# Patient Record
Sex: Female | Born: 1945 | Race: Black or African American | Hispanic: No | Smoking: Former smoker
Health system: Southern US, Community
[De-identification: ages and names within clinical notes are randomized; demographics above are authoritative.]

## PROBLEM LIST (undated history)

## (undated) DIAGNOSIS — I10 Essential (primary) hypertension: Secondary | ICD-10-CM

## (undated) HISTORY — PX: BACK SURGERY: SHX140

---

## 2005-10-12 ENCOUNTER — Ambulatory Visit: Payer: Self-pay | Admitting: Internal Medicine

## 2007-02-20 ENCOUNTER — Ambulatory Visit: Payer: Self-pay | Admitting: Internal Medicine

## 2007-02-26 ENCOUNTER — Ambulatory Visit: Payer: Self-pay | Admitting: Gastroenterology

## 2008-02-22 ENCOUNTER — Ambulatory Visit: Payer: Self-pay | Admitting: Internal Medicine

## 2009-03-10 ENCOUNTER — Ambulatory Visit: Payer: Self-pay | Admitting: Internal Medicine

## 2010-03-16 ENCOUNTER — Ambulatory Visit: Payer: Self-pay | Admitting: Internal Medicine

## 2010-12-01 ENCOUNTER — Ambulatory Visit: Payer: Self-pay | Admitting: Gastroenterology

## 2011-04-12 ENCOUNTER — Ambulatory Visit: Payer: Self-pay | Admitting: Internal Medicine

## 2012-04-12 ENCOUNTER — Ambulatory Visit: Payer: Self-pay | Admitting: Internal Medicine

## 2013-04-15 ENCOUNTER — Ambulatory Visit: Payer: Self-pay | Admitting: Internal Medicine

## 2013-07-28 ENCOUNTER — Emergency Department: Payer: Self-pay | Admitting: Emergency Medicine

## 2014-02-28 DIAGNOSIS — Z0000 Encounter for general adult medical examination without abnormal findings: Secondary | ICD-10-CM | POA: Diagnosis not present

## 2014-03-21 DIAGNOSIS — H269 Unspecified cataract: Secondary | ICD-10-CM | POA: Diagnosis not present

## 2014-04-17 ENCOUNTER — Ambulatory Visit: Payer: Self-pay | Admitting: Internal Medicine

## 2014-04-17 DIAGNOSIS — Z1231 Encounter for screening mammogram for malignant neoplasm of breast: Secondary | ICD-10-CM | POA: Diagnosis not present

## 2014-11-06 DIAGNOSIS — M722 Plantar fascial fibromatosis: Secondary | ICD-10-CM | POA: Diagnosis not present

## 2015-02-23 DIAGNOSIS — Z0000 Encounter for general adult medical examination without abnormal findings: Secondary | ICD-10-CM | POA: Diagnosis not present

## 2015-03-02 ENCOUNTER — Other Ambulatory Visit: Payer: Self-pay | Admitting: Internal Medicine

## 2015-03-02 DIAGNOSIS — Z0000 Encounter for general adult medical examination without abnormal findings: Secondary | ICD-10-CM | POA: Diagnosis not present

## 2015-03-02 DIAGNOSIS — Z6841 Body Mass Index (BMI) 40.0 and over, adult: Secondary | ICD-10-CM | POA: Diagnosis not present

## 2015-03-02 DIAGNOSIS — Z1231 Encounter for screening mammogram for malignant neoplasm of breast: Secondary | ICD-10-CM

## 2015-03-24 DIAGNOSIS — H2513 Age-related nuclear cataract, bilateral: Secondary | ICD-10-CM | POA: Diagnosis not present

## 2015-04-20 ENCOUNTER — Other Ambulatory Visit: Payer: Self-pay | Admitting: Internal Medicine

## 2015-04-20 ENCOUNTER — Ambulatory Visit
Admission: RE | Admit: 2015-04-20 | Discharge: 2015-04-20 | Disposition: A | Discharge status: Home / Self Care | Payer: Commercial Managed Care - HMO | Source: Ambulatory Visit | Attending: Internal Medicine | Admitting: Internal Medicine

## 2015-04-20 DIAGNOSIS — Z1231 Encounter for screening mammogram for malignant neoplasm of breast: Secondary | ICD-10-CM

## 2016-02-24 DIAGNOSIS — Z0000 Encounter for general adult medical examination without abnormal findings: Secondary | ICD-10-CM | POA: Diagnosis not present

## 2016-03-02 DIAGNOSIS — Z0000 Encounter for general adult medical examination without abnormal findings: Secondary | ICD-10-CM | POA: Diagnosis not present

## 2016-03-02 DIAGNOSIS — Z6841 Body Mass Index (BMI) 40.0 and over, adult: Secondary | ICD-10-CM | POA: Diagnosis not present

## 2016-03-08 ENCOUNTER — Other Ambulatory Visit: Payer: Self-pay | Admitting: Internal Medicine

## 2016-03-08 DIAGNOSIS — Z1231 Encounter for screening mammogram for malignant neoplasm of breast: Secondary | ICD-10-CM

## 2016-04-21 ENCOUNTER — Ambulatory Visit
Admission: RE | Admit: 2016-04-21 | Discharge: 2016-04-21 | Disposition: A | Discharge status: Home / Self Care | Payer: Commercial Managed Care - HMO | Source: Ambulatory Visit | Attending: Internal Medicine | Admitting: Internal Medicine

## 2016-04-21 DIAGNOSIS — Z1231 Encounter for screening mammogram for malignant neoplasm of breast: Secondary | ICD-10-CM | POA: Diagnosis not present

## 2016-05-25 DIAGNOSIS — H2513 Age-related nuclear cataract, bilateral: Secondary | ICD-10-CM | POA: Diagnosis not present

## 2017-02-24 DIAGNOSIS — Z0000 Encounter for general adult medical examination without abnormal findings: Secondary | ICD-10-CM | POA: Diagnosis not present

## 2017-02-24 DIAGNOSIS — I10 Essential (primary) hypertension: Secondary | ICD-10-CM | POA: Diagnosis not present

## 2017-03-03 DIAGNOSIS — Z6841 Body Mass Index (BMI) 40.0 and over, adult: Secondary | ICD-10-CM | POA: Diagnosis not present

## 2017-03-03 DIAGNOSIS — Z0000 Encounter for general adult medical examination without abnormal findings: Secondary | ICD-10-CM | POA: Diagnosis not present

## 2017-03-03 DIAGNOSIS — Z79899 Other long term (current) drug therapy: Secondary | ICD-10-CM | POA: Diagnosis not present

## 2017-03-27 ENCOUNTER — Other Ambulatory Visit: Payer: Self-pay | Admitting: Internal Medicine

## 2017-03-27 DIAGNOSIS — Z1231 Encounter for screening mammogram for malignant neoplasm of breast: Secondary | ICD-10-CM

## 2017-04-24 ENCOUNTER — Ambulatory Visit
Admission: RE | Admit: 2017-04-24 | Discharge: 2017-04-24 | Disposition: A | Discharge status: Home / Self Care | Payer: Medicare HMO | Source: Ambulatory Visit | Attending: Internal Medicine | Admitting: Internal Medicine

## 2017-04-24 DIAGNOSIS — Z1231 Encounter for screening mammogram for malignant neoplasm of breast: Secondary | ICD-10-CM | POA: Insufficient documentation

## 2017-05-29 DIAGNOSIS — H2513 Age-related nuclear cataract, bilateral: Secondary | ICD-10-CM | POA: Diagnosis not present

## 2017-06-05 DIAGNOSIS — Z0100 Encounter for examination of eyes and vision without abnormal findings: Secondary | ICD-10-CM | POA: Diagnosis not present

## 2018-02-28 DIAGNOSIS — Z79899 Other long term (current) drug therapy: Secondary | ICD-10-CM | POA: Diagnosis not present

## 2018-03-07 DIAGNOSIS — E538 Deficiency of other specified B group vitamins: Secondary | ICD-10-CM | POA: Diagnosis not present

## 2018-03-07 DIAGNOSIS — Z0000 Encounter for general adult medical examination without abnormal findings: Secondary | ICD-10-CM | POA: Diagnosis not present

## 2018-03-07 DIAGNOSIS — Z79899 Other long term (current) drug therapy: Secondary | ICD-10-CM | POA: Diagnosis not present

## 2018-03-07 DIAGNOSIS — Z6841 Body Mass Index (BMI) 40.0 and over, adult: Secondary | ICD-10-CM | POA: Diagnosis not present

## 2018-03-27 ENCOUNTER — Other Ambulatory Visit: Payer: Self-pay | Admitting: Internal Medicine

## 2018-03-27 DIAGNOSIS — Z1231 Encounter for screening mammogram for malignant neoplasm of breast: Secondary | ICD-10-CM

## 2018-08-07 ENCOUNTER — Other Ambulatory Visit: Payer: Self-pay

## 2018-08-07 ENCOUNTER — Ambulatory Visit
Admission: RE | Admit: 2018-08-07 | Discharge: 2018-08-07 | Disposition: A | Discharge status: Home / Self Care | Payer: Medicare HMO | Source: Ambulatory Visit | Attending: Internal Medicine | Admitting: Internal Medicine

## 2018-08-07 DIAGNOSIS — Z1231 Encounter for screening mammogram for malignant neoplasm of breast: Secondary | ICD-10-CM | POA: Diagnosis not present

## 2019-01-15 DIAGNOSIS — J0100 Acute maxillary sinusitis, unspecified: Secondary | ICD-10-CM | POA: Diagnosis not present

## 2019-01-15 DIAGNOSIS — Z87891 Personal history of nicotine dependence: Secondary | ICD-10-CM | POA: Diagnosis not present

## 2019-03-04 DIAGNOSIS — Z79899 Other long term (current) drug therapy: Secondary | ICD-10-CM | POA: Diagnosis not present

## 2019-03-04 DIAGNOSIS — E538 Deficiency of other specified B group vitamins: Secondary | ICD-10-CM | POA: Diagnosis not present

## 2019-03-11 DIAGNOSIS — Z0000 Encounter for general adult medical examination without abnormal findings: Secondary | ICD-10-CM | POA: Diagnosis not present

## 2019-03-11 DIAGNOSIS — R0609 Other forms of dyspnea: Secondary | ICD-10-CM | POA: Diagnosis not present

## 2019-03-11 DIAGNOSIS — Z6841 Body Mass Index (BMI) 40.0 and over, adult: Secondary | ICD-10-CM | POA: Diagnosis not present

## 2019-03-11 DIAGNOSIS — R0600 Dyspnea, unspecified: Secondary | ICD-10-CM | POA: Diagnosis not present

## 2019-03-11 DIAGNOSIS — I129 Hypertensive chronic kidney disease with stage 1 through stage 4 chronic kidney disease, or unspecified chronic kidney disease: Secondary | ICD-10-CM | POA: Diagnosis not present

## 2019-03-11 DIAGNOSIS — Z79899 Other long term (current) drug therapy: Secondary | ICD-10-CM | POA: Diagnosis not present

## 2019-03-11 DIAGNOSIS — N183 Chronic kidney disease, stage 3 unspecified: Secondary | ICD-10-CM | POA: Diagnosis not present

## 2019-07-03 ENCOUNTER — Other Ambulatory Visit: Payer: Self-pay | Admitting: Internal Medicine

## 2019-07-03 DIAGNOSIS — Z1231 Encounter for screening mammogram for malignant neoplasm of breast: Secondary | ICD-10-CM

## 2019-08-08 ENCOUNTER — Ambulatory Visit
Admission: RE | Admit: 2019-08-08 | Discharge: 2019-08-08 | Disposition: A | Discharge status: Home / Self Care | Payer: Medicare HMO | Source: Ambulatory Visit | Attending: Internal Medicine | Admitting: Internal Medicine

## 2019-08-08 DIAGNOSIS — Z1231 Encounter for screening mammogram for malignant neoplasm of breast: Secondary | ICD-10-CM | POA: Diagnosis not present

## 2019-09-05 DIAGNOSIS — H2513 Age-related nuclear cataract, bilateral: Secondary | ICD-10-CM | POA: Diagnosis not present

## 2019-09-09 DIAGNOSIS — Z79899 Other long term (current) drug therapy: Secondary | ICD-10-CM | POA: Diagnosis not present

## 2020-03-04 DIAGNOSIS — Z79899 Other long term (current) drug therapy: Secondary | ICD-10-CM | POA: Diagnosis not present

## 2020-03-11 DIAGNOSIS — N183 Chronic kidney disease, stage 3 unspecified: Secondary | ICD-10-CM | POA: Diagnosis not present

## 2020-03-11 DIAGNOSIS — Z79899 Other long term (current) drug therapy: Secondary | ICD-10-CM | POA: Diagnosis not present

## 2020-03-11 DIAGNOSIS — M7582 Other shoulder lesions, left shoulder: Secondary | ICD-10-CM | POA: Diagnosis not present

## 2020-03-11 DIAGNOSIS — Z0000 Encounter for general adult medical examination without abnormal findings: Secondary | ICD-10-CM | POA: Diagnosis not present

## 2020-03-11 DIAGNOSIS — Z6841 Body Mass Index (BMI) 40.0 and over, adult: Secondary | ICD-10-CM | POA: Diagnosis not present

## 2020-05-13 DIAGNOSIS — N1831 Chronic kidney disease, stage 3a: Secondary | ICD-10-CM | POA: Diagnosis not present

## 2020-07-01 ENCOUNTER — Other Ambulatory Visit: Payer: Self-pay | Admitting: Internal Medicine

## 2020-07-01 DIAGNOSIS — Z1231 Encounter for screening mammogram for malignant neoplasm of breast: Secondary | ICD-10-CM

## 2020-07-10 ENCOUNTER — Other Ambulatory Visit: Payer: Self-pay | Admitting: Orthopedic Surgery

## 2020-07-10 DIAGNOSIS — M19019 Primary osteoarthritis, unspecified shoulder: Secondary | ICD-10-CM | POA: Diagnosis not present

## 2020-07-10 DIAGNOSIS — M75102 Unspecified rotator cuff tear or rupture of left shoulder, not specified as traumatic: Secondary | ICD-10-CM | POA: Diagnosis not present

## 2020-07-10 DIAGNOSIS — M19012 Primary osteoarthritis, left shoulder: Secondary | ICD-10-CM

## 2020-07-10 DIAGNOSIS — M25512 Pain in left shoulder: Secondary | ICD-10-CM | POA: Diagnosis not present

## 2020-07-10 DIAGNOSIS — R29898 Other symptoms and signs involving the musculoskeletal system: Secondary | ICD-10-CM

## 2020-07-10 DIAGNOSIS — N1831 Chronic kidney disease, stage 3a: Secondary | ICD-10-CM | POA: Diagnosis not present

## 2020-07-23 ENCOUNTER — Other Ambulatory Visit: Payer: Self-pay

## 2020-07-23 ENCOUNTER — Ambulatory Visit
Admission: RE | Admit: 2020-07-23 | Discharge: 2020-07-23 | Disposition: A | Discharge status: Home / Self Care | Payer: Medicare HMO | Source: Ambulatory Visit | Attending: Orthopedic Surgery | Admitting: Orthopedic Surgery

## 2020-07-23 DIAGNOSIS — R29898 Other symptoms and signs involving the musculoskeletal system: Secondary | ICD-10-CM | POA: Diagnosis not present

## 2020-07-23 DIAGNOSIS — M19012 Primary osteoarthritis, left shoulder: Secondary | ICD-10-CM

## 2020-07-23 DIAGNOSIS — R600 Localized edema: Secondary | ICD-10-CM | POA: Diagnosis not present

## 2020-07-23 DIAGNOSIS — M25412 Effusion, left shoulder: Secondary | ICD-10-CM | POA: Diagnosis not present

## 2020-07-23 DIAGNOSIS — M7552 Bursitis of left shoulder: Secondary | ICD-10-CM | POA: Diagnosis not present

## 2020-07-31 DIAGNOSIS — M7502 Adhesive capsulitis of left shoulder: Secondary | ICD-10-CM | POA: Diagnosis not present

## 2020-08-10 ENCOUNTER — Ambulatory Visit
Admission: RE | Admit: 2020-08-10 | Discharge: 2020-08-10 | Disposition: A | Discharge status: Home / Self Care | Payer: Medicare HMO | Source: Ambulatory Visit | Attending: Internal Medicine | Admitting: Internal Medicine

## 2020-08-10 ENCOUNTER — Other Ambulatory Visit: Payer: Self-pay

## 2020-08-10 DIAGNOSIS — Z1231 Encounter for screening mammogram for malignant neoplasm of breast: Secondary | ICD-10-CM | POA: Diagnosis not present

## 2020-08-17 ENCOUNTER — Other Ambulatory Visit: Payer: Self-pay | Admitting: Surgery

## 2020-08-17 DIAGNOSIS — M7582 Other shoulder lesions, left shoulder: Secondary | ICD-10-CM | POA: Diagnosis not present

## 2020-08-20 ENCOUNTER — Other Ambulatory Visit: Payer: Self-pay

## 2020-08-20 ENCOUNTER — Encounter
Admission: RE | Admit: 2020-08-20 | Discharge: 2020-08-20 | Disposition: A | Discharge status: Home / Self Care | Payer: Medicare HMO | Source: Ambulatory Visit | Attending: Surgery | Admitting: Surgery

## 2020-08-20 HISTORY — DX: Essential (primary) hypertension: I10

## 2020-08-20 NOTE — Patient Instructions (Addendum)
Your procedure is scheduled on: 08/26/20 Report to DAY SURGERY DEPARTMENT LOCATED ON 2ND FLOOR MEDICAL MALL ENTRANCE. To find out your arrival time please call [phone] between 1PM - 3PM on 08/25/20 .  Remember: Instructions that are not followed completely may result in serious medical risk, up to and including death, or upon the discretion of your surgeon and anesthesiologist your surgery may need to be rescheduled.     _X__ 1. Do not eat food after midnight the night before your procedure.                 No gum chewing or hard candies. You may drink clear liquids up to 2 hours                 before you are scheduled to arrive for your surgery-                  Clear Liquids include:  water, apple juice without pulp, clear carbohydrate                 drink such as Clearfast or Gatorade, Black Coffee or Tea (Do not add                 anything to coffee or tea). Diabetics water only  Drink the Ensure "Clear" Pre Surgery drink 2 hours before arriving.  __X__2.  On the morning of surgery brush your teeth with toothpaste and water, you                 may rinse your mouth with mouthwash if you wish.  Do not swallow any              toothpaste of mouthwash.     _X__ 3.  No Alcohol for 24 hours before or after surgery.   _X__ 4.  Do Not Smoke or use e-cigarettes For 24 Hours Prior to Your Surgery.                 Do not use any chewable tobacco products for at least 6 hours prior to                 surgery.  ____  5.  Bring all medications with you on the day of surgery if instructed.   __X__  6.  Notify your doctor if there is any change in your medical condition      (cold, fever, infections).     Do not wear jewelry, make-up, hairpins, clips or nail polish. Do not wear lotions, powders, or perfumes. No Deodorant Do not shave 48 hours prior to surgery. Men may shave face and neck. Do not bring valuables to the hospital.    [facility] is not responsible for any belongings or  valuables.  Contacts, dentures/partials or body piercings may not be worn into surgery. Bring a case for your contacts, glasses or hearing aids, a denture cup will be supplied. Leave your suitcase in the car. After surgery it may be brought to your room. For patients admitted to the hospital, discharge time is determined by your treatment team.   Patients discharged the day of surgery will not be allowed to drive home.   Please read over the following fact sheets that you were given:   MRSA Information, Ensure drink, Incentive spirometer, CHG soap  __X__ Take these medicines the morning of surgery with A SIP OF WATER:    1. none  2.   3.  4.  5.  6.  ____ Fleet Enema (as directed)   __X__ Use CHG Soap/SAGE wipes as directed  ____ Use inhalers on the day of surgery  ____ Stop metformin/Janumet/Farxiga 2 days prior to surgery    ____ Take 1/2 of usual insulin dose the night before surgery. No insulin the morning          of surgery.   ____ Stop Blood Thinners Coumadin/Plavix/Xarelto/Pleta/Pradaxa/Eliquis/Effient/Aspirin  on   Or contact your Surgeon, Cardiologist or Medical Doctor regarding  ability to stop your blood thinners  __X__ Stop Anti-inflammatories 7 days before surgery such as Advil, Ibuprofen, Motrin,  BC or Goodies Powder, Naprosyn, Naproxen, Aleve, Aspirin    __X__ Stop all herbal supplements, fish oil or vitamin E until after surgery.    ____ Bring C-Pap to the hospital.    Stop the naproxen sodium (ALEVE) starting today 08/20/20 per telephone instructions. This may be resumed after your surgery.

## 2020-08-24 ENCOUNTER — Other Ambulatory Visit: Payer: Self-pay

## 2020-08-24 ENCOUNTER — Other Ambulatory Visit
Admission: RE | Admit: 2020-08-24 | Discharge: 2020-08-24 | Disposition: A | Discharge status: Home / Self Care | Payer: Medicare HMO | Source: Ambulatory Visit | Attending: Surgery | Admitting: Surgery

## 2020-08-24 DIAGNOSIS — Z01818 Encounter for other preprocedural examination: Secondary | ICD-10-CM | POA: Diagnosis not present

## 2020-08-24 LAB — BASIC METABOLIC PANEL
Anion gap: 9 (ref 5–15)
BUN: 22 mg/dL (ref 8–23)
CO2: 29 mmol/L (ref 22–32)
Calcium: 9.4 mg/dL (ref 8.9–10.3)
Chloride: 102 mmol/L (ref 98–111)
Creatinine, Ser: 1.35 mg/dL — ABNORMAL HIGH (ref 0.44–1.00)
GFR, Estimated: 41 mL/min — ABNORMAL LOW (ref >60)
Glucose, Bld: 92 mg/dL (ref 70–99)
Potassium: 3.4 mmol/L — ABNORMAL LOW (ref 3.5–5.1)
Sodium: 140 mmol/L (ref 135–145)

## 2020-08-24 LAB — CBC
HCT: 36.3 % (ref 36.0–46.0)
Hemoglobin: 11.6 g/dL — ABNORMAL LOW (ref 12.0–15.0)
MCH: 26.8 pg (ref 26.0–34.0)
MCHC: 32 g/dL (ref 30.0–36.0)
MCV: 83.8 fL (ref 80.0–100.0)
Platelets: 240 10*3/uL (ref 150–400)
RBC: 4.33 MIL/uL (ref 3.87–5.11)
RDW: 13.4 % (ref 11.5–15.5)
WBC: 5.3 10*3/uL (ref 4.0–10.5)
nRBC: 0 % (ref 0.0–0.2)

## 2020-08-26 ENCOUNTER — Encounter: Payer: Self-pay | Admitting: Surgery

## 2020-08-26 ENCOUNTER — Ambulatory Visit: Payer: Medicare HMO

## 2020-08-26 ENCOUNTER — Encounter
Admission: RE | Disposition: A | Discharge status: Home / Self Care | Payer: Self-pay | Source: Home / Self Care | Attending: Surgery

## 2020-08-26 ENCOUNTER — Ambulatory Visit: Payer: Medicare HMO | Admitting: Anesthesiology

## 2020-08-26 ENCOUNTER — Ambulatory Visit
Admission: RE | Admit: 2020-08-26 | Discharge: 2020-08-26 | Disposition: A | Discharge status: Home / Self Care | Payer: Medicare HMO | Attending: Surgery | Admitting: Surgery

## 2020-08-26 ENCOUNTER — Other Ambulatory Visit: Payer: Self-pay

## 2020-08-26 DIAGNOSIS — M24112 Other articular cartilage disorders, left shoulder: Secondary | ICD-10-CM | POA: Diagnosis not present

## 2020-08-26 DIAGNOSIS — M19012 Primary osteoarthritis, left shoulder: Secondary | ICD-10-CM | POA: Insufficient documentation

## 2020-08-26 DIAGNOSIS — M25512 Pain in left shoulder: Secondary | ICD-10-CM | POA: Diagnosis not present

## 2020-08-26 DIAGNOSIS — M659 Synovitis and tenosynovitis, unspecified: Secondary | ICD-10-CM | POA: Insufficient documentation

## 2020-08-26 DIAGNOSIS — M75112 Incomplete rotator cuff tear or rupture of left shoulder, not specified as traumatic: Secondary | ICD-10-CM | POA: Diagnosis not present

## 2020-08-26 DIAGNOSIS — Z888 Allergy status to other drugs, medicaments and biological substances status: Secondary | ICD-10-CM | POA: Diagnosis not present

## 2020-08-26 DIAGNOSIS — M7522 Bicipital tendinitis, left shoulder: Secondary | ICD-10-CM | POA: Diagnosis not present

## 2020-08-26 DIAGNOSIS — Z87891 Personal history of nicotine dependence: Secondary | ICD-10-CM | POA: Diagnosis not present

## 2020-08-26 DIAGNOSIS — M94212 Chondromalacia, left shoulder: Secondary | ICD-10-CM | POA: Insufficient documentation

## 2020-08-26 DIAGNOSIS — M25812 Other specified joint disorders, left shoulder: Secondary | ICD-10-CM | POA: Diagnosis not present

## 2020-08-26 DIAGNOSIS — Z419 Encounter for procedure for purposes other than remedying health state, unspecified: Secondary | ICD-10-CM

## 2020-08-26 DIAGNOSIS — G8918 Other acute postprocedural pain: Secondary | ICD-10-CM | POA: Diagnosis not present

## 2020-08-26 DIAGNOSIS — M7592 Shoulder lesion, unspecified, left shoulder: Secondary | ICD-10-CM | POA: Diagnosis not present

## 2020-08-26 DIAGNOSIS — Z79899 Other long term (current) drug therapy: Secondary | ICD-10-CM | POA: Insufficient documentation

## 2020-08-26 DIAGNOSIS — M7582 Other shoulder lesions, left shoulder: Secondary | ICD-10-CM | POA: Diagnosis not present

## 2020-08-26 HISTORY — PX: SHOULDER ARTHROSCOPY WITH SUBACROMIAL DECOMPRESSION, ROTATOR CUFF REPAIR AND BICEP TENDON REPAIR: SHX5687

## 2020-08-26 SURGERY — SHOULDER ARTHROSCOPY WITH SUBACROMIAL DECOMPRESSION, ROTATOR CUFF REPAIR AND BICEP TENDON REPAIR
Anesthesia: General | Site: Shoulder | Laterality: Left

## 2020-08-26 MED ORDER — FAMOTIDINE 20 MG PO TABS
ORAL_TABLET | ORAL | Status: AC
  Administered 2020-08-26: 20 mg via ORAL
  Filled 2020-08-26: qty 1

## 2020-08-26 MED ORDER — BUPIVACAINE-EPINEPHRINE (PF) 0.5% -1:200000 IJ SOLN
INTRAMUSCULAR | Status: AC
  Filled 2020-08-26: qty 30

## 2020-08-26 MED ORDER — ONDANSETRON HCL 4 MG PO TABS: 4.0000 mg | ORAL_TABLET | Freq: Four times a day (QID) | ORAL | Status: DC | PRN

## 2020-08-26 MED ORDER — ORAL CARE MOUTH RINSE: 15.0000 mL | Freq: Once | OROMUCOSAL | Status: AC

## 2020-08-26 MED ORDER — CEFAZOLIN SODIUM-DEXTROSE 2-4 GM/100ML-% IV SOLN
2.0000 g | INTRAVENOUS | Status: AC
  Administered 2020-08-26: 2 g via INTRAVENOUS

## 2020-08-26 MED ORDER — METOCLOPRAMIDE HCL 5 MG/ML IJ SOLN: 5.0000 mg | Freq: Three times a day (TID) | INTRAMUSCULAR | Status: DC | PRN

## 2020-08-26 MED ORDER — EPHEDRINE SULFATE 50 MG/ML IJ SOLN
INTRAMUSCULAR | Status: DC | PRN
Start: 2020-08-26 — End: 2020-08-26
  Administered 2020-08-26: 5 mg via INTRAVENOUS

## 2020-08-26 MED ORDER — LACTATED RINGERS IV SOLN: INTRAVENOUS | Status: DC

## 2020-08-26 MED ORDER — LIDOCAINE HCL (PF) 1 % IJ SOLN
INTRAMUSCULAR | Status: DC | PRN
  Administered 2020-08-26: 3 mL via SUBCUTANEOUS

## 2020-08-26 MED ORDER — PHENYLEPHRINE HCL (PRESSORS) 10 MG/ML IV SOLN
INTRAVENOUS | Status: DC | PRN
  Administered 2020-08-26: 150 ug via INTRAVENOUS
  Administered 2020-08-26 (×4): 100 ug via INTRAVENOUS

## 2020-08-26 MED ORDER — PROPOFOL 10 MG/ML IV BOLUS
INTRAVENOUS | Status: DC | PRN
  Administered 2020-08-26: 160 mg via INTRAVENOUS

## 2020-08-26 MED ORDER — METOCLOPRAMIDE HCL 10 MG PO TABS: 5.0000 mg | ORAL_TABLET | Freq: Three times a day (TID) | ORAL | Status: DC | PRN

## 2020-08-26 MED ORDER — BUPIVACAINE-EPINEPHRINE 0.5% -1:200000 IJ SOLN
INTRAMUSCULAR | Status: DC | PRN
  Administered 2020-08-26: 30 mL

## 2020-08-26 MED ORDER — CHLORHEXIDINE GLUCONATE 0.12 % MT SOLN
OROMUCOSAL | Status: AC
  Administered 2020-08-26: 15 mL via OROMUCOSAL
  Filled 2020-08-26: qty 15

## 2020-08-26 MED ORDER — POTASSIUM CHLORIDE IN NACL 20-0.9 MEQ/L-% IV SOLN
INTRAVENOUS | Status: DC
  Filled 2020-08-26 (×3): qty 1000

## 2020-08-26 MED ORDER — CEFAZOLIN SODIUM-DEXTROSE 2-4 GM/100ML-% IV SOLN
INTRAVENOUS | Status: AC
  Filled 2020-08-26: qty 100

## 2020-08-26 MED ORDER — DEXAMETHASONE SODIUM PHOSPHATE 10 MG/ML IJ SOLN
INTRAMUSCULAR | Status: DC | PRN
  Administered 2020-08-26: 10 mg via INTRAVENOUS

## 2020-08-26 MED ORDER — LIDOCAINE HCL (CARDIAC) PF 100 MG/5ML IV SOSY
PREFILLED_SYRINGE | INTRAVENOUS | Status: DC | PRN
  Administered 2020-08-26: 60 mg via INTRAVENOUS

## 2020-08-26 MED ORDER — PHENYLEPHRINE HCL (PRESSORS) 10 MG/ML IV SOLN
INTRAVENOUS | Status: AC
  Filled 2020-08-26: qty 1

## 2020-08-26 MED ORDER — GLYCOPYRROLATE 0.2 MG/ML IJ SOLN
INTRAMUSCULAR | Status: DC | PRN
  Administered 2020-08-26: .2 mg via INTRAVENOUS

## 2020-08-26 MED ORDER — PROPOFOL 10 MG/ML IV BOLUS
INTRAVENOUS | Status: AC
  Filled 2020-08-26: qty 40

## 2020-08-26 MED ORDER — BUPIVACAINE HCL (PF) 0.5 % IJ SOLN
INTRAMUSCULAR | Status: DC | PRN
  Administered 2020-08-26: 10 mL via PERINEURAL

## 2020-08-26 MED ORDER — OXYCODONE HCL 5 MG PO TABS: 5.0000 mg | ORAL_TABLET | ORAL | 0 refills | Status: AC | PRN

## 2020-08-26 MED ORDER — SODIUM CHLORIDE 0.9 % IR SOLN
Status: DC | PRN
  Administered 2020-08-26: 900 mL
  Administered 2020-08-26: 3000 mL

## 2020-08-26 MED ORDER — BUPIVACAINE LIPOSOME 1.3 % IJ SUSP
INTRAMUSCULAR | Status: AC
  Filled 2020-08-26: qty 20

## 2020-08-26 MED ORDER — CHLORHEXIDINE GLUCONATE 0.12 % MT SOLN: 15.0000 mL | Freq: Once | OROMUCOSAL | Status: AC

## 2020-08-26 MED ORDER — FENTANYL CITRATE (PF) 100 MCG/2ML IJ SOLN
INTRAMUSCULAR | Status: DC | PRN
  Administered 2020-08-26: 50 ug via INTRAVENOUS

## 2020-08-26 MED ORDER — MIDAZOLAM HCL 2 MG/2ML IJ SOLN
INTRAMUSCULAR | Status: AC
  Administered 2020-08-26: 1 mg via INTRAVENOUS
  Filled 2020-08-26: qty 2

## 2020-08-26 MED ORDER — LACTATED RINGERS IV SOLN: INTRAVENOUS | Status: DC | PRN

## 2020-08-26 MED ORDER — BUPIVACAINE HCL (PF) 0.5 % IJ SOLN
INTRAMUSCULAR | Status: AC
  Filled 2020-08-26: qty 10

## 2020-08-26 MED ORDER — SODIUM CHLORIDE 0.9 % IV SOLN
INTRAVENOUS | Status: DC | PRN
  Administered 2020-08-26: 30 ug/min via INTRAVENOUS

## 2020-08-26 MED ORDER — PROPOFOL 1000 MG/100ML IV EMUL
INTRAVENOUS | Status: AC
  Filled 2020-08-26: qty 100

## 2020-08-26 MED ORDER — ROCURONIUM BROMIDE 100 MG/10ML IV SOLN
INTRAVENOUS | Status: DC | PRN
Start: 2020-08-26 — End: 2020-08-26
  Administered 2020-08-26: 60 mg via INTRAVENOUS

## 2020-08-26 MED ORDER — MIDAZOLAM HCL 2 MG/2ML IJ SOLN
1.0000 mg | INTRAMUSCULAR | Status: DC | PRN
Start: 2020-08-26 — End: 2020-08-26

## 2020-08-26 MED ORDER — SUGAMMADEX SODIUM 200 MG/2ML IV SOLN
INTRAVENOUS | Status: DC | PRN
  Administered 2020-08-26: 200 mg via INTRAVENOUS

## 2020-08-26 MED ORDER — BUPIVACAINE LIPOSOME 1.3 % IJ SUSP
INTRAMUSCULAR | Status: DC | PRN
  Administered 2020-08-26: 20 mL via PERINEURAL

## 2020-08-26 MED ORDER — FAMOTIDINE 20 MG PO TABS: 20.0000 mg | ORAL_TABLET | Freq: Once | ORAL | Status: AC

## 2020-08-26 MED ORDER — ONDANSETRON HCL 4 MG/2ML IJ SOLN: 4.0000 mg | Freq: Four times a day (QID) | INTRAMUSCULAR | Status: DC | PRN

## 2020-08-26 MED ORDER — ONDANSETRON HCL 4 MG/2ML IJ SOLN
INTRAMUSCULAR | Status: DC | PRN
  Administered 2020-08-26: 4 mg via INTRAVENOUS

## 2020-08-26 MED ORDER — LIDOCAINE HCL (PF) 1 % IJ SOLN
INTRAMUSCULAR | Status: AC
  Filled 2020-08-26: qty 5

## 2020-08-26 MED ORDER — FENTANYL CITRATE (PF) 100 MCG/2ML IJ SOLN
INTRAMUSCULAR | Status: AC
  Filled 2020-08-26: qty 2

## 2020-08-26 MED ORDER — OXYCODONE HCL 5 MG PO TABS: 10.0000 mg | ORAL_TABLET | ORAL | Status: DC | PRN

## 2020-08-26 MED ORDER — OXYCODONE HCL 5 MG PO TABS: 5.0000 mg | ORAL_TABLET | ORAL | Status: DC | PRN

## 2020-08-26 SURGICAL SUPPLY — 49 items
ANCHOR JUGGERKNOT WTAP NDL 2.9 (Anchor) IMPLANT
ANCHOR SUT QUATTRO KNTLS 4.5 (Anchor) IMPLANT
ANCHOR SUT W/ ORTHOCORD (Anchor) IMPLANT
APL PRP STRL LF DISP 70% ISPRP (MISCELLANEOUS) ×2
BIT DRILL JUGRKNT W/NDL BIT2.9 (DRILL) IMPLANT
BLADE FULL RADIUS 3.5 (BLADE) ×2 IMPLANT
BUR ACROMIONIZER 4.0 (BURR) ×2 IMPLANT
CANNULA SHAVER 8MMX76MM (CANNULA) ×2 IMPLANT
CHLORAPREP W/TINT 26 (MISCELLANEOUS) ×3 IMPLANT
COVER MAYO STAND REUSABLE (DRAPES) ×2 IMPLANT
DRAPE IMP U-DRAPE 54X76 (DRAPES) ×4 IMPLANT
DRILL JUGGERKNOT W/NDL BIT 2.9 (DRILL)
ELECT CAUTERY BLADE 6.4 (BLADE) ×2 IMPLANT
ELECT REM PT RETURN 9FT ADLT (ELECTROSURGICAL) ×2
ELECTRODE REM PT RTRN 9FT ADLT (ELECTROSURGICAL) ×1 IMPLANT
GAUZE SPONGE 4X4 12PLY STRL (GAUZE/BANDAGES/DRESSINGS) ×2 IMPLANT
GAUZE XEROFORM 1X8 LF (GAUZE/BANDAGES/DRESSINGS) ×2 IMPLANT
GLOVE SRG 8 PF TXTR STRL LF DI (GLOVE) ×1 IMPLANT
GLOVE SURG ENC MOIS LTX SZ7.5 (GLOVE) ×4 IMPLANT
GLOVE SURG ENC MOIS LTX SZ8 (GLOVE) ×4 IMPLANT
GLOVE SURG UNDER LTX SZ8 (GLOVE) ×2 IMPLANT
GLOVE SURG UNDER POLY LF SZ8 (GLOVE) ×2
GOWN STRL REUS W/ TWL LRG LVL3 (GOWN DISPOSABLE) ×1 IMPLANT
GOWN STRL REUS W/ TWL XL LVL3 (GOWN DISPOSABLE) ×1 IMPLANT
GOWN STRL REUS W/TWL LRG LVL3 (GOWN DISPOSABLE) ×2
GOWN STRL REUS W/TWL XL LVL3 (GOWN DISPOSABLE) ×2
GRASPER SUT 15 45D LOW PRO (SUTURE) IMPLANT
IV D 5 LR 1000ML (IV SOLUTION) ×1 IMPLANT
IV LACTATED RINGER IRRG 3000ML (IV SOLUTION) ×2
IV LR IRRIG 3000ML ARTHROMATIC (IV SOLUTION) ×2 IMPLANT
KIT CANNULA 8X76-LX IN CANNULA (CANNULA) IMPLANT
MANIFOLD NEPTUNE II (INSTRUMENTS) ×3 IMPLANT
MASK FACE SPIDER DISP (MASK) ×2 IMPLANT
MAT ABSORB  FLUID 56X50 GRAY (MISCELLANEOUS) ×1
MAT ABSORB FLUID 56X50 GRAY (MISCELLANEOUS) ×1 IMPLANT
PACK ARTHROSCOPY SHOULDER (MISCELLANEOUS) ×2 IMPLANT
PASSER SUT FIRSTPASS SELF (INSTRUMENTS) IMPLANT
SLING ARM LRG DEEP (SOFTGOODS) ×2 IMPLANT
SLING ULTRA II LG (MISCELLANEOUS) ×1 IMPLANT
SPONGE T-LAP 18X18 ~~LOC~~+RFID (SPONGE) ×2 IMPLANT
STAPLER SKIN PROX 35W (STAPLE) ×2 IMPLANT
STRAP SAFETY 5IN WIDE (MISCELLANEOUS) ×2 IMPLANT
SUT ETHIBOND 0 MO6 C/R (SUTURE) ×2 IMPLANT
SUT ULTRABRAID 2 COBRAID 38 (SUTURE) IMPLANT
SUT VIC AB 2-0 CT1 27 (SUTURE) ×4
SUT VIC AB 2-0 CT1 TAPERPNT 27 (SUTURE) ×2 IMPLANT
TAPE MICROFOAM 4IN (TAPE) ×2 IMPLANT
TUBING INFLOW SET DBFLO PUMP (TUBING) ×2 IMPLANT
WAND WEREWOLF FLOW 90D (MISCELLANEOUS) ×2 IMPLANT

## 2020-08-26 NOTE — H&P (Signed)
History of Present Illness:  Denise Cain is a 75 y.o. female who presents today as a result of a referral from Van Clines, [place], for left shoulder pain and stiffness.   The patient's symptoms began about 5 months ago and developed without any specific cause or injury. She saw her primary care provider who gave her a steroid injection which provided little if any relief of her symptoms. Therefore, the patient was referred to orthopedics. She saw Van Clines, PA, who ordered an MRI scan of the shoulder. He was concerned about adhesive capsulitis and referred the patient to me for further evaluation and treatment. The patient describes the symptoms as moderate (patient is active but has had to make modifications or give up activities) and have the quality of being aching, miserable, nagging, stabbing, tender and throbbing. The pain is localized to the lateral arm/shoulder. These symptoms are aggravated constantly, with normal daily activities, with sleeping, carrying heavy objects, at higher levels of activity, with overhead activity and reaching behind the back. She has tried acetaminophen and non-steroidal anti-inflammatories (Aleve and Lodine) with limited benefit. She has tried rest and ice with limited benefit. She has tried the 1 injection described above, but has not tried any formal physical therapy. The patient denies any prior problems with her left shoulder. She denies any neck pain, nor did she note any numbness or paresthesias down her arm to her hand. The patient is right-hand dominant.  This complaint is not work related. She is a sports non-participant.  Shoulder Surgical History:  The patient has had no shoulder surgery in the past.  PMH/PSH/Family History/Social History/Meds/Allergies:  I have reviewed past medical, surgical, social and family history, medications and allergies as documented in the EMR.  Current Outpatient Medications:  cholecalciferol (VITAMIN D3) 2,000 unit capsule  Take 2,000 Units by mouth once daily.   etodolac (LODINE) 400 MG tablet TAKE 1 TABLET BY MOUTH ONCE DAILY 90 tablet 3   hydroCHLOROthiazide (HYDRODIURIL) 25 MG tablet Take 25 mg by mouth once daily   losartan (COZAAR) 50 MG tablet Take 1 tablet (50 mg total) by mouth once daily 90 tablet 3   magnesium oxide 500 mg Tab Take 500 mg by mouth once daily.   meclizine (ANTIVERT) 25 mg tablet Take 1 tablet daily prn   Allergies:   Ace Inhibitors Cough   History reviewed. No pertinent past medical history.  Past Surgical History:   LAMINECTOMY LUMBAR SPINE   History reviewed. No pertinent family history.  Social History:   Socioeconomic History:   Marital status: Married  Tobacco Use   Smoking status: Former Smoker   Smokeless tobacco: Never Used  Building services engineer Use: Never used   Review of Systems:  A comprehensive 14 point ROS was performed, reviewed, and the pertinent orthopaedic findings are documented in the HPI.  Physical Exam:  Vitals:  08/17/20 1047  BP: 126/78  Weight: (!) 117.7 kg (259 lb 6.4 oz)  Height: 167.6 cm (5\' 6" )  PainSc: 7  PainLoc: Shoulder   General/Constitutional: Pleasant significantly overweight elderly female in no acute distress. Neuro/Psych: Normal mood and affect, oriented to person, place and time. Eyes: Non-icteric. Pupils are equal, round, and reactive to light, and exhibit synchronous movement. ENT: Unremarkable. Lymphatic: No palpable adenopathy. Respiratory: Lungs clear to auscultation, Normal chest excursion, No wheezes and Non-labored breathing Cardiovascular: Regular rate and rhythm. No murmurs. and No edema, swelling or tenderness, except as noted in detailed exam. Integumentary: No impressive skin lesions present,  except as noted in detailed exam. Musculoskeletal: Unremarkable, except as noted in detailed exam.  Left shoulder exam: SKIN: normal SWELLING: none WARMTH: none LYMPH NODES: no adenopathy palpable CREPITUS:  none TENDERNESS: Mildly tender over anterolateral shoulder ROM (active):  Forward flexion: 95 degrees Abduction: 40 degrees Internal rotation: Left PSIS ROM (passive):  Forward flexion: 145 degrees Abduction: 135 degrees  ER/IR at 90 abd: 90 degrees / 60 degrees  She notes moderate to severe pain with active forward flexion and abduction, and mild-moderate pain passively at the extremes of all motions.  STRENGTH: Forward flexion: 3/5 Abduction: 2/5 External rotation: 4-4+/5 Internal rotation: 4+/5 Pain with RC testing: Moderate-severe pain with resisted abduction more so than with resisted forward flexion  STABILITY: Normal  SPECIAL TESTS: Juanetta Gosling' test: positive, moderate Speed's test: Not evaluated Capsulitis - pain w/ passive ER: no Crossed arm test: Mildly positive Crank: Not evaluated Anterior apprehension: Negative Posterior apprehension: Not evaluated  She is neurovascularly intact to the left upper extremity.  Shoulder Imaging, MRI: Left Shoulder: MRI Shoulder Cartilage: No cartilage abnormality. MRI Shoulder Rotator Cuff: Moderate tendinopathy of the supraspinatus tendon with mild bursal surface fraying. Moderate fluid in the subacromial bursa. MRI Shoulder Labrum / Biceps: No labral tear or biceps abormality. MRI Shoulder Bone: Normal bone.  Both the films and report were reviewed by myself and discussed with the patient.  Assessment:  Rotator cuff tendinitis, left.   Plan:  The treatment options were discussed with the patient. In addition, patient educational materials were provided regarding the diagnosis and treatment options. The patient is quite frustrated by her symptoms and functional limitations, and is ready to consider more aggressive treatment options. Based on her examination and MRI scan, I do not feel that she has adhesive capsulitis. Rather, I feel that she has an area of impingement upon the rotator cuff resulting from a prominent anterolateral  corner of the acromion. Therefore, I have recommended a surgical procedure, specifically a left shoulder arthroscopy with debridement, decompression, and possible biceps tenolysis. The procedure was discussed with the patient, as were the potential risks (including bleeding, infection, nerve and/or blood vessel injury, persistent or recurrent pain, failure of the repair, progression of arthritis, need for further surgery, blood clots, strokes, heart attacks and/or arhythmias, pneumonia, etc.) and benefits. The patient states her understanding and wishes to proceed. All of the patient's questions and concerns were answered. She can call any time with further concerns. She will follow up post-surgery, routine.   H&P reviewed and patient re-examined. No changes.

## 2020-08-26 NOTE — Anesthesia Procedure Notes (Signed)
Anesthesia Regional Block: Interscalene brachial plexus block   Pre-Anesthetic Checklist: , timeout performed,  Correct Patient, Correct Site, Correct Laterality,  Correct Procedure, Correct Position, site marked,  Risks and benefits discussed,  Surgical consent,  Pre-op evaluation,  At surgeon's request and post-op pain management  Laterality: Left and Upper  Prep: chloraprep       Needles:  Injection technique: Single-shot  Needle Type: Echogenic Stimulator Needle     Needle Length: 10cm  Needle Gauge: 20     Additional Needles:   Procedures:,,,, ultrasound used (permanent image in chart),,     Nerve Stimulator or Paresthesia:  Response: biceps flexion  Additional Responses:   Narrative:  Start time: 08/26/2020 7:10 AM End time: 08/26/2020 7:16 AM Injection made incrementally with aspirations every 5 mL.  Performed by: Personally   Additional Notes: Functioning IV was confirmed and monitors were applied.   Sterile prep and drape,hand hygiene and sterile gloves were used.  Negative aspiration and negative test dose prior to incremental administration of local anesthetic. The patient tolerated the procedure well.

## 2020-08-26 NOTE — Op Note (Signed)
08/26/2020  9:23 AM  Patient:   Denise Cain  Pre-Op Diagnosis:   Impingement/tendinopathy with partial-thickness rotator cuff tear and possible biceps tendinitis, left shoulder.  Post-Op Diagnosis:   Impingement/tendinopathy with shallow bursal-sided partial-thickness rotator cuff tear, moderate degenerative joint disease, degenerative labral fraying, and biceps tendinopathy, left shoulder.  Procedure:   Extensive arthroscopic debridement with arthroscopic subacromial decompression and biceps tenolysis, left shoulder.  Anesthesia:   General endotracheal with interscalene block using Exparel placed preoperatively by the anesthesiologist.  Surgeon:   Maryagnes Amos, MD  Assistant:   Amedeo Plenty, PA-S  Findings:   As above.  There was moderate tendinopathy involving the anterior and mid portions of the's supraspinatus tendon with fraying of both the articular and bursal surfaces, but no obvious compromise of the footprint.  The remainder the rotator cuff was in excellent condition.  There was moderate degenerative fraying of the labrum superiorly and posteriorly without frank detachment from the glenoid rim.  There were diffuse grade 3 chondromalacial changes involving both the humeral head and glenoid.  The biceps demonstrated an area of intense "lip sticking" but no partial or full-thickness tearing was identified.  Complications:   None  Fluids:   400 cc  Estimated blood loss:   10 cc  Tourniquet time:   None  Drains:   None  Closure:   Staples      Brief clinical note:   The patient is a 75 year old female with a 5 to 58-month history of left shoulder pain and weakness. The patient's symptoms have progressed despite medications, activity modification, etc. The patient's history and examination are consistent with impingement/tendinopathy with a possible rotator cuff tear. The patient's preoperative MRI scan demonstrated moderate tendinopathy of the supraspinatus tendon  with an area of obvious impingement against the downsloping lateral acromion, but no significant partial-thickness or full-thickness rotator cuff tear. The patient presents at this time for definitive management of these shoulder symptoms.  Procedure:   The patient underwent placement of an interscalene block using Exparel by the anesthesiologist in the preoperative holding area before being brought into the operating room and lain in the supine position. The patient then underwent general endotracheal intubation and anesthesia before being repositioned in the beach chair position using the beach chair positioner. The left shoulder and upper extremity were prepped with ChloraPrep solution before being draped sterilely. Preoperative antibiotics were administered. A timeout was performed to confirm the proper surgical site before the expected portal sites and incision site were injected with 0.5% Sensorcaine with epinephrine.   A posterior portal was created and the glenohumeral joint thoroughly inspected with the findings as described above. An anterior portal was created using an outside-in technique. The labrum and rotator cuff were further probed, again confirming the above-noted findings. The areas of labral fraying were debrided back to stable margins using the full-radius resector. The full-radius sector also was used to debride the areas of fraying involving the articular side of the supraspinatus tendon as well as the areas of extensive synovitis anteriorly, superiorly and posteriorly. Finally, the full-radius resector was used to debride the areas of grade 3 chondromalacial fraying on both the humerus and glenoid surfaces. The ArthroCare wand was inserted and used to release the biceps tendon from its labral anchor. It also was used to obtain hemostasis as well as to "anneal" the labrum superiorly and anteriorly. The instruments were removed from the joint after suctioning the excess fluid.  The camera  was repositioned through the posterior portal into  the subacromial space. A separate lateral portal was created using an outside-in technique. The 3.5 mm full-radius resector was introduced and used to perform a subtotal bursectomy. The ArthroCare wand was then inserted and used to remove the periosteal tissue off the undersurface of the anterior third of the acromion as well as to recess the coracoacromial ligament from its attachment along the anterior and lateral margins of the acromion. The 4.0 mm acromionizing bur was introduced and used to complete the decompression by removing the undersurface of the anterior third of the acromion. The full radius resector was reintroduced to remove any residual bony debris before the ArthroCare wand was reintroduced to obtain hemostasis. The bursal surface of the rotator cuff was carefully inspected. There were areas of superficial fraying without any significant partial-thickness or full-thickness tears. The areas of superficial fraying were debrided back to stable margins using the full-radius resector. The instruments were then removed from the subacromial space after suctioning the excess fluid.  The portal sites were closed using staples. A sterile bulky dressing was applied to the shoulder before the arm was placed into a shoulder sling. The patient was then awakened, extubated, and returned to the recovery room in satisfactory condition after tolerating the procedure well.

## 2020-08-26 NOTE — Anesthesia Procedure Notes (Signed)
Procedure Name: Intubation Date/Time: 08/26/2020 7:41 AM Performed by: Allena Katz, Beldon Nowling, CRNA Pre-anesthesia Checklist: Patient identified, Patient being monitored, Timeout performed, Emergency Drugs available and Suction available Patient Re-evaluated:Patient Re-evaluated prior to induction Oxygen Delivery Method: Circle system utilized Preoxygenation: Pre-oxygenation with 100% oxygen Induction Type: IV induction Ventilation: Mask ventilation without difficulty and Oral airway inserted - appropriate to patient size Laryngoscope Size: 3 and McGraph Grade View: Grade II Tube type: Oral Tube size: 7.5 mm Number of attempts: 1 Airway Equipment and Method: Bougie stylet Placement Confirmation: ETT inserted through vocal cords under direct vision, positive ETCO2 and breath sounds checked- equal and bilateral Secured at: 23 cm Tube secured with: Tape Dental Injury: Bloody posterior oropharynx  Comments: Eyes taped prior to intubation. Bougie used to guide ETT through cords.

## 2020-08-26 NOTE — Discharge Instructions (Addendum)
Orthopedic discharge instructions: Keep dressing dry and intact.  May shower after dressing changed on post-op day #4 (Sunday).  Cover staples/sutures with Band-Aids after drying off. Apply ice frequently to shoulder. Take Aleve 2 tabs BID OR ibuprofen 600 mg TID with meals for 5-7 days, then as necessary. Take oxycodone as prescribed when needed.  May supplement with ES Tylenol if necessary. Keep shoulder immobilizer on at all times except may remove for bathing purposes. Follow-up in 10-14 days or as scheduled.AMBULATORY SURGERY  DISCHARGE INSTRUCTIONS   The drugs that you were given will stay in your system until tomorrow so for the next 24 hours you should not:  Drive an automobile Make any legal decisions Drink any alcoholic beverage   You may resume regular meals tomorrow.  Today it is better to start with liquids and gradually work up to solid foods.  You may eat anything you prefer, but it is better to start with liquids, then soup and crackers, and gradually work up to solid foods.   Please notify your doctor immediately if you have any unusual bleeding, trouble breathing, redness and pain at the surgery site, drainage, fever, or pain not relieved by medication.    Additional Instructions:        Please contact your physician with any problems or Same Day Surgery at [phone], Monday through Friday 6 am to 4 pm, or [place] at [facility] number at [phone]. AMBULATORY SURGERY  DISCHARGE INSTRUCTIONS   The drugs that you were given will stay in your system until tomorrow so for the next 24 hours you should not:  Drive an automobile Make any legal decisions Drink any alcoholic beverage   You may resume regular meals tomorrow.  Today it is better to start with liquids and gradually work up to solid foods.  You may eat anything you prefer, but it is better to start with liquids, then soup and crackers, and gradually work up to solid  foods.   Please notify your doctor immediately if you have any unusual bleeding, trouble breathing, redness and pain at the surgery site, drainage, fever, or pain not relieved by medication.    Additional Instructions:        Please contact your physician with any problems or Same Day Surgery at [phone], Monday through Friday 6 am to 4 pm, or  at [facility] number at [phone].

## 2020-08-26 NOTE — Transfer of Care (Signed)
Immediate Anesthesia Transfer of Care Note  Patient: Denise Cain  Procedure(s) Performed: LEFT SHOULDER ARTHROSCOPY WITH DEBRIDEMENT, DECOMPRESSION, AND  BICEPS TENOLYSIS - RNFA (Left: Shoulder)  Patient Location: PACU  Anesthesia Type:General  Level of Consciousness: drowsy  Airway & Oxygen Therapy: Patient Spontanous Breathing and Patient connected to face mask oxygen  Post-op Assessment: Report given to RN and Post -op Vital signs reviewed and stable  Post vital signs: Reviewed and stable  Last Vitals:  Vitals Value Taken Time  BP 150/76 08/26/20 0909  Temp    Pulse 95 08/26/20 0913  Resp 18 08/26/20 0913  SpO2 99 % 08/26/20 0913  Vitals shown include unvalidated device data.  Last Pain:  Vitals:   08/26/20 0617  TempSrc: Temporal  PainSc: 5          Complications: No notable events documented.

## 2020-08-31 NOTE — Anesthesia Preprocedure Evaluation (Signed)
Anesthesia Evaluation  Patient identified by MRN, date of birth, ID band Patient awake    Reviewed: Allergy & Precautions, H&P , NPO status , Patient's Chart, lab work & pertinent test results, reviewed documented beta blocker date and time   Airway Mallampati: III  TM Distance: >3 FB Neck ROM: full    Dental  (+) Teeth Intact   Pulmonary neg pulmonary ROS, former smoker,    Pulmonary exam normal        Cardiovascular hypertension, On Medications negative cardio ROS Normal cardiovascular exam Rhythm:regular Rate:Normal     Neuro/Psych negative neurological ROS  negative psych ROS   GI/Hepatic negative GI ROS, Neg liver ROS,   Endo/Other  negative endocrine ROS  Renal/GU negative Renal ROS  negative genitourinary   Musculoskeletal   Abdominal   Peds  Hematology negative hematology ROS (+)   Anesthesia Other Findings Past Medical History: No date: Hypertension Past Surgical History: No date: BACK SURGERY     Comment:  bulging disc 08/26/2020: SHOULDER ARTHROSCOPY WITH SUBACROMIAL DECOMPRESSION,  ROTATOR CUFF REPAIR AND BICEP TENDON REPAIR; Left     Comment:  Procedure: LEFT SHOULDER ARTHROSCOPY WITH DEBRIDEMENT,               DECOMPRESSION, AND  BICEPS TENOLYSIS - RNFA;  Surgeon:               Christena Flake, MD;  Location: [facility] ORS;  Service:               Orthopedics;  Laterality: Left; BMI    Body Mass Index: 41.48 kg/m     Reproductive/Obstetrics negative OB ROS                             Anesthesia Physical Anesthesia Plan  ASA: 2  Anesthesia Plan: General ETT   Post-op Pain Management:    Induction:   PONV Risk Score and Plan:   Airway Management Planned:   Additional Equipment:   Intra-op Plan:   Post-operative Plan:   Informed Consent: I have reviewed the patients History and Physical, chart, labs and discussed the procedure including the risks, benefits and  alternatives for the proposed anesthesia with the patient or authorized representative who has indicated his/her understanding and acceptance.     Dental Advisory Given  Plan Discussed with: CRNA  Anesthesia Plan Comments:         Anesthesia Quick Evaluation

## 2020-08-31 NOTE — Anesthesia Postprocedure Evaluation (Signed)
Anesthesia Post Note  Patient: Denise Cain  Procedure(s) Performed: LEFT SHOULDER ARTHROSCOPY WITH DEBRIDEMENT, DECOMPRESSION, AND  BICEPS TENOLYSIS - RNFA (Left: Shoulder)  Patient location during evaluation: PACU Anesthesia Type: General Level of consciousness: awake and alert Pain management: pain level controlled Vital Signs Assessment: post-procedure vital signs reviewed and stable Respiratory status: spontaneous breathing, nonlabored ventilation, respiratory function stable and patient connected to nasal cannula oxygen Cardiovascular status: blood pressure returned to baseline and stable Postop Assessment: no apparent nausea or vomiting Anesthetic complications: no   No notable events documented.   Last Vitals:  Vitals:   08/26/20 0937 08/26/20 0946  BP:  136/67  Pulse: 88 89  Resp: 20 16  Temp: (!) 36.1 C (!) 36.2 C  SpO2: 96% 95%    Last Pain:  Vitals:   08/26/20 0946  TempSrc: Temporal  PainSc: 0-No pain                 Yevette Edwards

## 2020-09-02 DIAGNOSIS — Z9889 Other specified postprocedural states: Secondary | ICD-10-CM | POA: Diagnosis not present

## 2020-09-02 DIAGNOSIS — M6281 Muscle weakness (generalized): Secondary | ICD-10-CM | POA: Diagnosis not present

## 2020-09-02 DIAGNOSIS — G8929 Other chronic pain: Secondary | ICD-10-CM | POA: Diagnosis not present

## 2020-09-02 DIAGNOSIS — M25512 Pain in left shoulder: Secondary | ICD-10-CM | POA: Diagnosis not present

## 2020-09-02 DIAGNOSIS — M25612 Stiffness of left shoulder, not elsewhere classified: Secondary | ICD-10-CM | POA: Diagnosis not present

## 2020-09-08 DIAGNOSIS — Z9889 Other specified postprocedural states: Secondary | ICD-10-CM | POA: Diagnosis not present

## 2020-09-08 DIAGNOSIS — M25512 Pain in left shoulder: Secondary | ICD-10-CM | POA: Diagnosis not present

## 2020-09-09 DIAGNOSIS — Z0100 Encounter for examination of eyes and vision without abnormal findings: Secondary | ICD-10-CM | POA: Diagnosis not present

## 2020-09-09 DIAGNOSIS — H2513 Age-related nuclear cataract, bilateral: Secondary | ICD-10-CM | POA: Diagnosis not present

## 2020-09-09 DIAGNOSIS — Z6841 Body Mass Index (BMI) 40.0 and over, adult: Secondary | ICD-10-CM | POA: Diagnosis not present

## 2020-09-17 DIAGNOSIS — Z9889 Other specified postprocedural states: Secondary | ICD-10-CM | POA: Diagnosis not present

## 2020-09-17 DIAGNOSIS — M25512 Pain in left shoulder: Secondary | ICD-10-CM | POA: Diagnosis not present

## 2020-09-25 DIAGNOSIS — M25512 Pain in left shoulder: Secondary | ICD-10-CM | POA: Diagnosis not present

## 2020-09-25 DIAGNOSIS — Z9889 Other specified postprocedural states: Secondary | ICD-10-CM | POA: Diagnosis not present

## 2020-09-30 DIAGNOSIS — M25512 Pain in left shoulder: Secondary | ICD-10-CM | POA: Diagnosis not present

## 2020-09-30 DIAGNOSIS — Z9889 Other specified postprocedural states: Secondary | ICD-10-CM | POA: Diagnosis not present

## 2020-10-07 ENCOUNTER — Encounter: Payer: Self-pay | Admitting: Surgery

## 2020-10-08 DIAGNOSIS — M25612 Stiffness of left shoulder, not elsewhere classified: Secondary | ICD-10-CM | POA: Diagnosis not present

## 2020-10-08 DIAGNOSIS — Z9889 Other specified postprocedural states: Secondary | ICD-10-CM | POA: Diagnosis not present

## 2020-10-08 DIAGNOSIS — M6281 Muscle weakness (generalized): Secondary | ICD-10-CM | POA: Diagnosis not present

## 2020-10-08 DIAGNOSIS — M25512 Pain in left shoulder: Secondary | ICD-10-CM | POA: Diagnosis not present

## 2020-10-08 DIAGNOSIS — G8929 Other chronic pain: Secondary | ICD-10-CM | POA: Diagnosis not present

## 2020-10-13 DIAGNOSIS — G8929 Other chronic pain: Secondary | ICD-10-CM | POA: Diagnosis not present

## 2020-10-13 DIAGNOSIS — Z9889 Other specified postprocedural states: Secondary | ICD-10-CM | POA: Diagnosis not present

## 2020-10-13 DIAGNOSIS — M25612 Stiffness of left shoulder, not elsewhere classified: Secondary | ICD-10-CM | POA: Diagnosis not present

## 2020-10-13 DIAGNOSIS — M6281 Muscle weakness (generalized): Secondary | ICD-10-CM | POA: Diagnosis not present

## 2020-10-13 DIAGNOSIS — M25512 Pain in left shoulder: Secondary | ICD-10-CM | POA: Diagnosis not present

## 2020-10-21 DIAGNOSIS — M6281 Muscle weakness (generalized): Secondary | ICD-10-CM | POA: Diagnosis not present

## 2020-10-21 DIAGNOSIS — M25512 Pain in left shoulder: Secondary | ICD-10-CM | POA: Diagnosis not present

## 2020-10-21 DIAGNOSIS — Z9889 Other specified postprocedural states: Secondary | ICD-10-CM | POA: Diagnosis not present

## 2020-10-21 DIAGNOSIS — M25612 Stiffness of left shoulder, not elsewhere classified: Secondary | ICD-10-CM | POA: Diagnosis not present

## 2020-10-21 DIAGNOSIS — G8929 Other chronic pain: Secondary | ICD-10-CM | POA: Diagnosis not present

## 2020-10-27 DIAGNOSIS — M25512 Pain in left shoulder: Secondary | ICD-10-CM | POA: Diagnosis not present

## 2020-10-27 DIAGNOSIS — M25612 Stiffness of left shoulder, not elsewhere classified: Secondary | ICD-10-CM | POA: Diagnosis not present

## 2020-10-27 DIAGNOSIS — Z9889 Other specified postprocedural states: Secondary | ICD-10-CM | POA: Diagnosis not present

## 2020-10-27 DIAGNOSIS — G8929 Other chronic pain: Secondary | ICD-10-CM | POA: Diagnosis not present

## 2020-10-27 DIAGNOSIS — M6281 Muscle weakness (generalized): Secondary | ICD-10-CM | POA: Diagnosis not present

## 2020-11-03 DIAGNOSIS — M25512 Pain in left shoulder: Secondary | ICD-10-CM | POA: Diagnosis not present

## 2020-11-03 DIAGNOSIS — G8929 Other chronic pain: Secondary | ICD-10-CM | POA: Diagnosis not present

## 2020-11-03 DIAGNOSIS — M6281 Muscle weakness (generalized): Secondary | ICD-10-CM | POA: Diagnosis not present

## 2020-11-03 DIAGNOSIS — Z9889 Other specified postprocedural states: Secondary | ICD-10-CM | POA: Diagnosis not present

## 2020-11-03 DIAGNOSIS — M25612 Stiffness of left shoulder, not elsewhere classified: Secondary | ICD-10-CM | POA: Diagnosis not present

## 2020-11-10 DIAGNOSIS — M25612 Stiffness of left shoulder, not elsewhere classified: Secondary | ICD-10-CM | POA: Diagnosis not present

## 2020-11-10 DIAGNOSIS — Z9889 Other specified postprocedural states: Secondary | ICD-10-CM | POA: Diagnosis not present

## 2020-11-10 DIAGNOSIS — M25512 Pain in left shoulder: Secondary | ICD-10-CM | POA: Diagnosis not present

## 2020-11-10 DIAGNOSIS — G8929 Other chronic pain: Secondary | ICD-10-CM | POA: Diagnosis not present

## 2020-11-10 DIAGNOSIS — M6281 Muscle weakness (generalized): Secondary | ICD-10-CM | POA: Diagnosis not present

## 2020-11-18 DIAGNOSIS — M25612 Stiffness of left shoulder, not elsewhere classified: Secondary | ICD-10-CM | POA: Diagnosis not present

## 2020-11-18 DIAGNOSIS — Z9889 Other specified postprocedural states: Secondary | ICD-10-CM | POA: Diagnosis not present

## 2020-11-18 DIAGNOSIS — M25512 Pain in left shoulder: Secondary | ICD-10-CM | POA: Diagnosis not present

## 2020-11-18 DIAGNOSIS — G8929 Other chronic pain: Secondary | ICD-10-CM | POA: Diagnosis not present

## 2020-11-18 DIAGNOSIS — M6281 Muscle weakness (generalized): Secondary | ICD-10-CM | POA: Diagnosis not present

## 2020-11-27 DIAGNOSIS — M25612 Stiffness of left shoulder, not elsewhere classified: Secondary | ICD-10-CM | POA: Diagnosis not present

## 2020-11-27 DIAGNOSIS — M7582 Other shoulder lesions, left shoulder: Secondary | ICD-10-CM | POA: Diagnosis not present

## 2020-11-27 DIAGNOSIS — M7522 Bicipital tendinitis, left shoulder: Secondary | ICD-10-CM | POA: Diagnosis not present

## 2020-11-27 DIAGNOSIS — M6281 Muscle weakness (generalized): Secondary | ICD-10-CM | POA: Diagnosis not present

## 2020-11-27 DIAGNOSIS — Z9889 Other specified postprocedural states: Secondary | ICD-10-CM | POA: Diagnosis not present

## 2020-11-27 DIAGNOSIS — M25512 Pain in left shoulder: Secondary | ICD-10-CM | POA: Diagnosis not present

## 2020-11-27 DIAGNOSIS — G8929 Other chronic pain: Secondary | ICD-10-CM | POA: Diagnosis not present

## 2020-11-27 DIAGNOSIS — M75112 Incomplete rotator cuff tear or rupture of left shoulder, not specified as traumatic: Secondary | ICD-10-CM | POA: Diagnosis not present

## 2020-11-27 DIAGNOSIS — M24112 Other articular cartilage disorders, left shoulder: Secondary | ICD-10-CM | POA: Diagnosis not present

## 2020-11-27 DIAGNOSIS — M19012 Primary osteoarthritis, left shoulder: Secondary | ICD-10-CM | POA: Diagnosis not present

## 2020-12-03 DIAGNOSIS — G8929 Other chronic pain: Secondary | ICD-10-CM | POA: Diagnosis not present

## 2020-12-03 DIAGNOSIS — Z9889 Other specified postprocedural states: Secondary | ICD-10-CM | POA: Diagnosis not present

## 2020-12-03 DIAGNOSIS — M25612 Stiffness of left shoulder, not elsewhere classified: Secondary | ICD-10-CM | POA: Diagnosis not present

## 2020-12-03 DIAGNOSIS — M6281 Muscle weakness (generalized): Secondary | ICD-10-CM | POA: Diagnosis not present

## 2020-12-03 DIAGNOSIS — M25512 Pain in left shoulder: Secondary | ICD-10-CM | POA: Diagnosis not present

## 2020-12-10 DIAGNOSIS — M25512 Pain in left shoulder: Secondary | ICD-10-CM | POA: Diagnosis not present

## 2020-12-10 DIAGNOSIS — M6281 Muscle weakness (generalized): Secondary | ICD-10-CM | POA: Diagnosis not present

## 2020-12-10 DIAGNOSIS — G8929 Other chronic pain: Secondary | ICD-10-CM | POA: Diagnosis not present

## 2020-12-10 DIAGNOSIS — Z9889 Other specified postprocedural states: Secondary | ICD-10-CM | POA: Diagnosis not present

## 2020-12-10 DIAGNOSIS — M25612 Stiffness of left shoulder, not elsewhere classified: Secondary | ICD-10-CM | POA: Diagnosis not present

## 2020-12-18 DIAGNOSIS — M25612 Stiffness of left shoulder, not elsewhere classified: Secondary | ICD-10-CM | POA: Diagnosis not present

## 2020-12-18 DIAGNOSIS — M25512 Pain in left shoulder: Secondary | ICD-10-CM | POA: Diagnosis not present

## 2020-12-18 DIAGNOSIS — G8929 Other chronic pain: Secondary | ICD-10-CM | POA: Diagnosis not present

## 2020-12-18 DIAGNOSIS — Z9889 Other specified postprocedural states: Secondary | ICD-10-CM | POA: Diagnosis not present

## 2020-12-18 DIAGNOSIS — M6281 Muscle weakness (generalized): Secondary | ICD-10-CM | POA: Diagnosis not present

## 2020-12-31 DIAGNOSIS — G8929 Other chronic pain: Secondary | ICD-10-CM | POA: Diagnosis not present

## 2020-12-31 DIAGNOSIS — M25512 Pain in left shoulder: Secondary | ICD-10-CM | POA: Diagnosis not present

## 2020-12-31 DIAGNOSIS — Z9889 Other specified postprocedural states: Secondary | ICD-10-CM | POA: Diagnosis not present

## 2020-12-31 DIAGNOSIS — M6281 Muscle weakness (generalized): Secondary | ICD-10-CM | POA: Diagnosis not present

## 2020-12-31 DIAGNOSIS — M25612 Stiffness of left shoulder, not elsewhere classified: Secondary | ICD-10-CM | POA: Diagnosis not present

## 2021-01-22 DIAGNOSIS — M7582 Other shoulder lesions, left shoulder: Secondary | ICD-10-CM | POA: Diagnosis not present

## 2021-01-22 DIAGNOSIS — M75112 Incomplete rotator cuff tear or rupture of left shoulder, not specified as traumatic: Secondary | ICD-10-CM | POA: Diagnosis not present

## 2021-01-22 DIAGNOSIS — M19012 Primary osteoarthritis, left shoulder: Secondary | ICD-10-CM | POA: Diagnosis not present

## 2021-01-22 DIAGNOSIS — M24112 Other articular cartilage disorders, left shoulder: Secondary | ICD-10-CM | POA: Diagnosis not present

## 2021-01-22 DIAGNOSIS — M7522 Bicipital tendinitis, left shoulder: Secondary | ICD-10-CM | POA: Diagnosis not present

## 2021-03-09 DIAGNOSIS — Z79899 Other long term (current) drug therapy: Secondary | ICD-10-CM | POA: Diagnosis not present

## 2021-03-09 DIAGNOSIS — N1831 Chronic kidney disease, stage 3a: Secondary | ICD-10-CM | POA: Diagnosis not present

## 2021-03-16 DIAGNOSIS — N183 Chronic kidney disease, stage 3 unspecified: Secondary | ICD-10-CM | POA: Diagnosis not present

## 2021-03-16 DIAGNOSIS — E669 Obesity, unspecified: Secondary | ICD-10-CM | POA: Diagnosis not present

## 2021-03-16 DIAGNOSIS — Z6841 Body Mass Index (BMI) 40.0 and over, adult: Secondary | ICD-10-CM | POA: Diagnosis not present

## 2021-03-16 DIAGNOSIS — D500 Iron deficiency anemia secondary to blood loss (chronic): Secondary | ICD-10-CM | POA: Diagnosis not present

## 2021-03-16 DIAGNOSIS — Z0000 Encounter for general adult medical examination without abnormal findings: Secondary | ICD-10-CM | POA: Diagnosis not present

## 2021-03-16 DIAGNOSIS — Z79899 Other long term (current) drug therapy: Secondary | ICD-10-CM | POA: Diagnosis not present

## 2021-03-16 DIAGNOSIS — Z1389 Encounter for screening for other disorder: Secondary | ICD-10-CM | POA: Diagnosis not present

## 2021-03-25 DIAGNOSIS — D500 Iron deficiency anemia secondary to blood loss (chronic): Secondary | ICD-10-CM | POA: Diagnosis not present

## 2021-04-13 DIAGNOSIS — E538 Deficiency of other specified B group vitamins: Secondary | ICD-10-CM | POA: Diagnosis not present

## 2021-04-13 DIAGNOSIS — D500 Iron deficiency anemia secondary to blood loss (chronic): Secondary | ICD-10-CM | POA: Diagnosis not present

## 2021-07-22 ENCOUNTER — Other Ambulatory Visit: Payer: Self-pay | Admitting: Internal Medicine

## 2021-07-22 DIAGNOSIS — Z1231 Encounter for screening mammogram for malignant neoplasm of breast: Secondary | ICD-10-CM

## 2021-08-18 ENCOUNTER — Ambulatory Visit
Admission: RE | Admit: 2021-08-18 | Discharge: 2021-08-18 | Disposition: A | Discharge status: Home / Self Care | Payer: Medicare HMO | Source: Ambulatory Visit | Attending: Internal Medicine | Admitting: Internal Medicine

## 2021-08-18 DIAGNOSIS — Z1231 Encounter for screening mammogram for malignant neoplasm of breast: Secondary | ICD-10-CM | POA: Insufficient documentation

## 2022-02-17 DIAGNOSIS — H2513 Age-related nuclear cataract, bilateral: Secondary | ICD-10-CM | POA: Diagnosis not present

## 2022-02-17 DIAGNOSIS — Z0100 Encounter for examination of eyes and vision without abnormal findings: Secondary | ICD-10-CM | POA: Diagnosis not present

## 2022-03-10 DIAGNOSIS — E538 Deficiency of other specified B group vitamins: Secondary | ICD-10-CM | POA: Diagnosis not present

## 2022-03-10 DIAGNOSIS — Z79899 Other long term (current) drug therapy: Secondary | ICD-10-CM | POA: Diagnosis not present

## 2022-03-17 DIAGNOSIS — Z79899 Other long term (current) drug therapy: Secondary | ICD-10-CM | POA: Diagnosis not present

## 2022-03-17 DIAGNOSIS — Z1331 Encounter for screening for depression: Secondary | ICD-10-CM | POA: Diagnosis not present

## 2022-03-17 DIAGNOSIS — E669 Obesity, unspecified: Secondary | ICD-10-CM | POA: Diagnosis not present

## 2022-03-17 DIAGNOSIS — Z0000 Encounter for general adult medical examination without abnormal findings: Secondary | ICD-10-CM | POA: Diagnosis not present

## 2022-03-17 DIAGNOSIS — J329 Chronic sinusitis, unspecified: Secondary | ICD-10-CM | POA: Diagnosis not present

## 2022-03-17 DIAGNOSIS — Z6841 Body Mass Index (BMI) 40.0 and over, adult: Secondary | ICD-10-CM | POA: Diagnosis not present

## 2022-07-18 ENCOUNTER — Other Ambulatory Visit: Payer: Self-pay

## 2022-07-18 DIAGNOSIS — Z1231 Encounter for screening mammogram for malignant neoplasm of breast: Secondary | ICD-10-CM

## 2022-08-17 IMAGING — MR MR SHOULDER*L* W/O CM
4 of 5 series · 28 of 40 positions shown · non-contrast
Comparison: None.

CLINICAL DATA: Left shoulder pain with painful and limited range of
motion

EXAM:
MRI OF THE LEFT SHOULDER WITHOUT CONTRAST
TECHNIQUE: Multiplanar, multisequence MR imaging of the shoulder was performed.
No intravenous contrast was administered.

[Series 5: T2 fat-sat · axial · left · 4.0mm · 0.44mm/px · z∈[-108,+12]mm · 9 of 26 slices shown (1 of 3)]
[im 1/26]
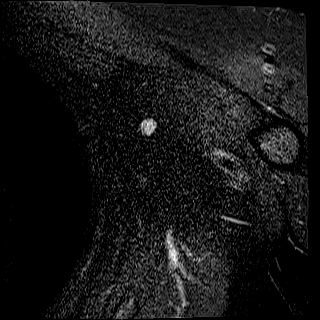
[im 4/26]
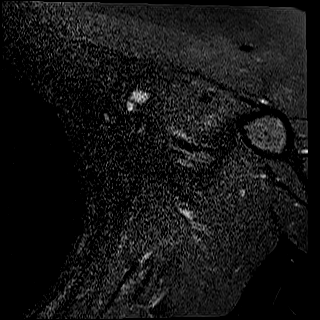
[im 7/26]
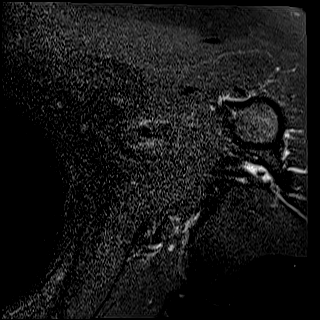
[im 10/26]
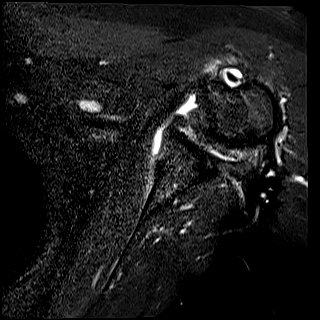
[im 13/26]
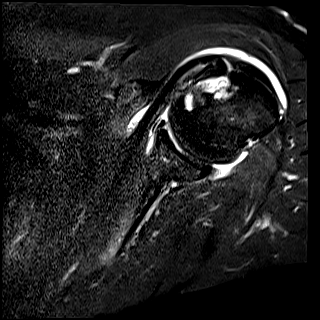
[im 16/26]
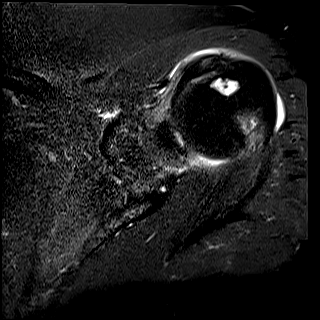
[im 19/26]
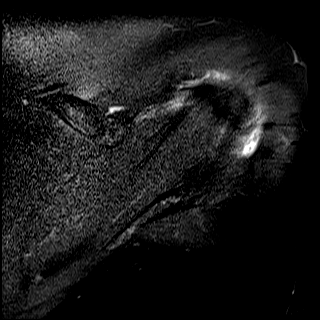
[im 22/26]
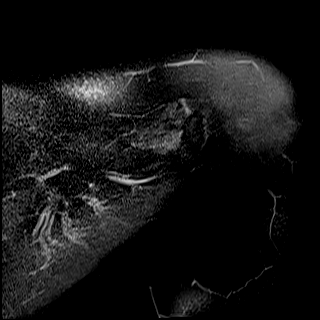
[im 26/26]
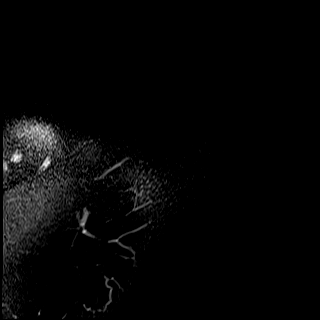

[Series 6: T2 fat-sat · oblique · left · 4.0mm · 0.22mm/px · 7 of 20 slices shown (2 of 3)]
[im 1/20]
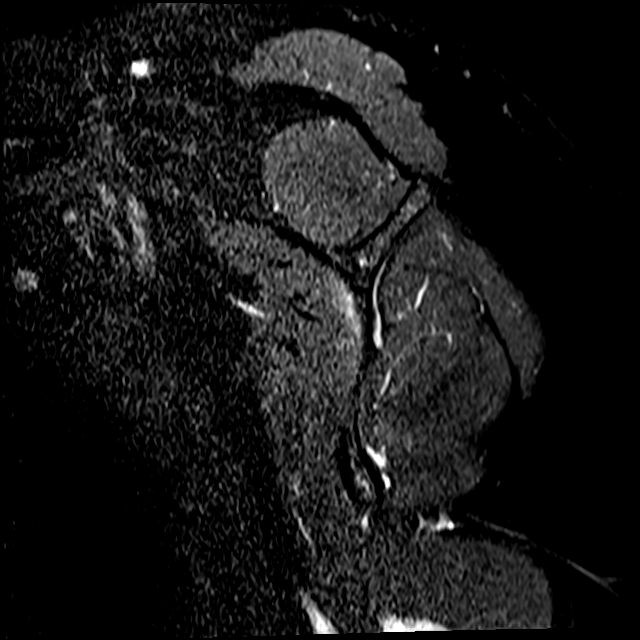
[im 4/20]
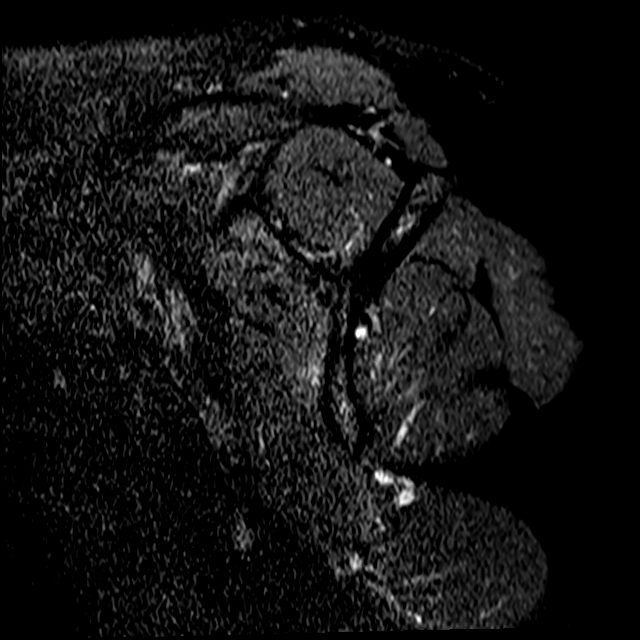
[im 7/20]
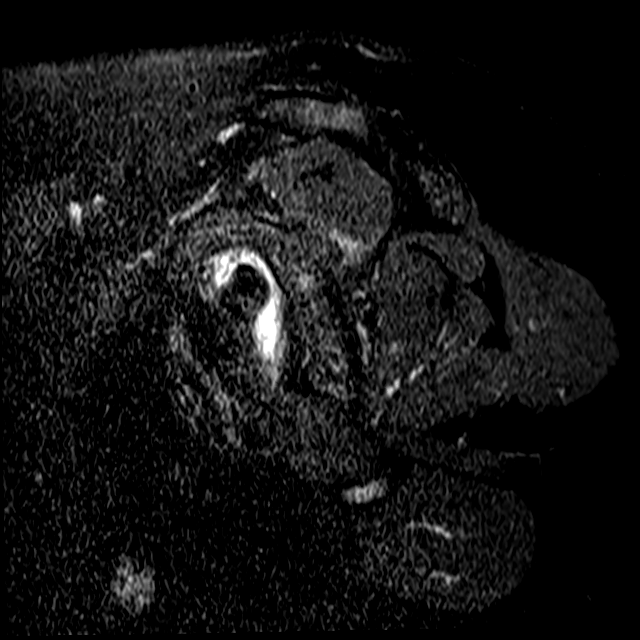
[im 10/20]
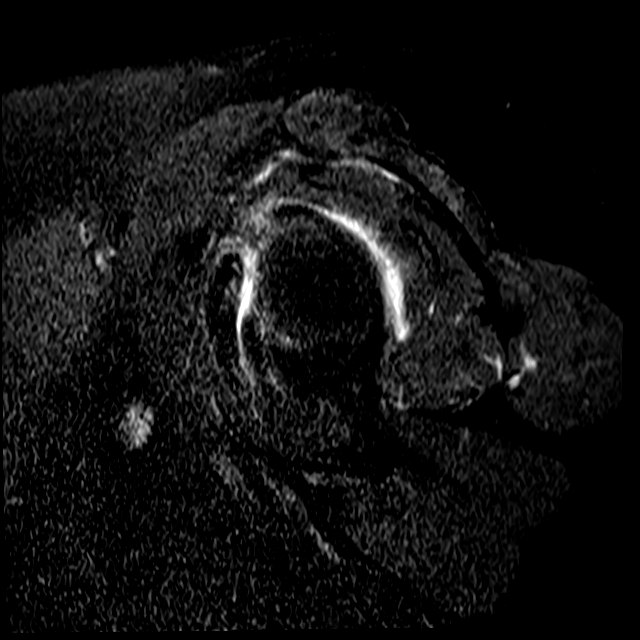
[im 13/20]
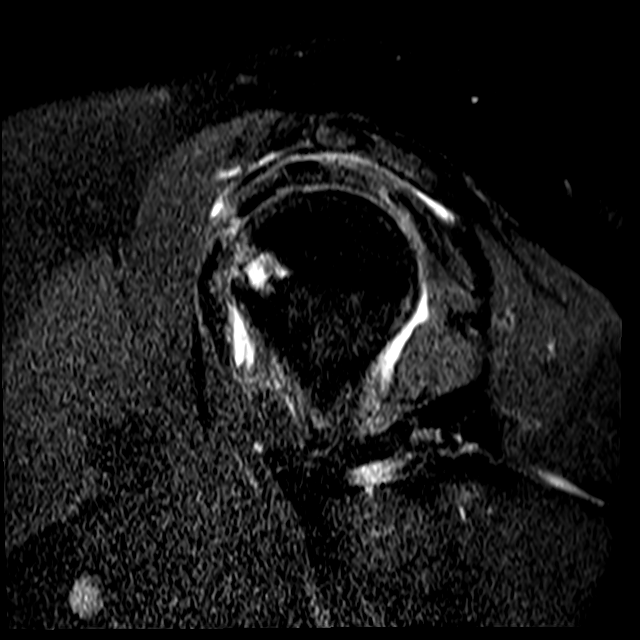
[im 16/20]
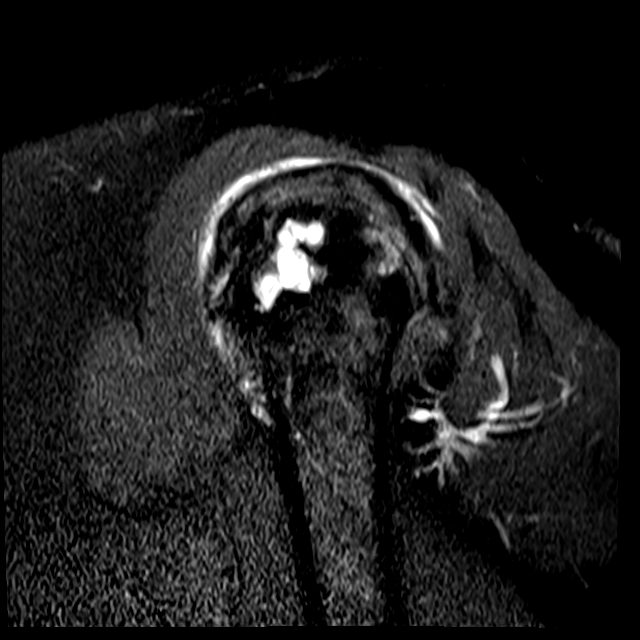
[im 20/20]
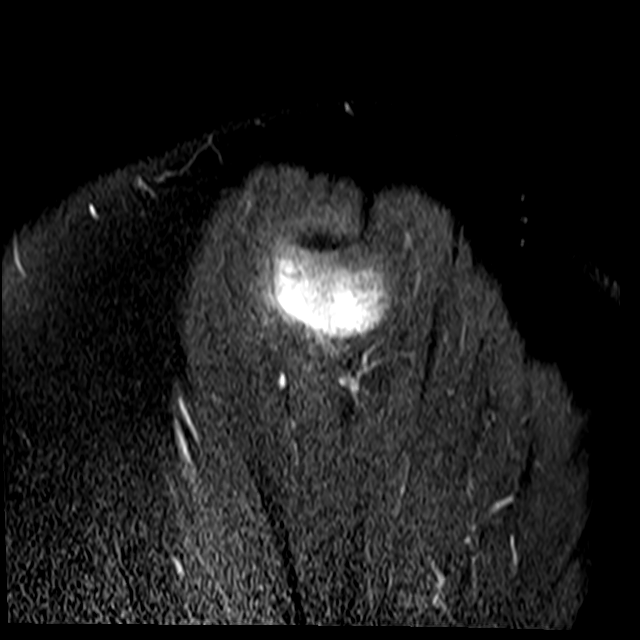

[Series 8: PD · oblique · left · 4.0mm · 0.44mm/px · 8 of 20 slices shown]
[im 1/20]
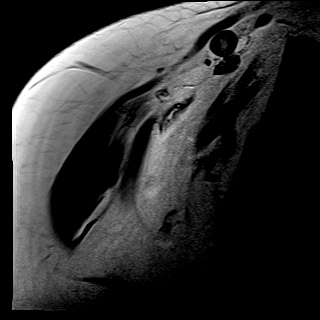
[im 3/20]
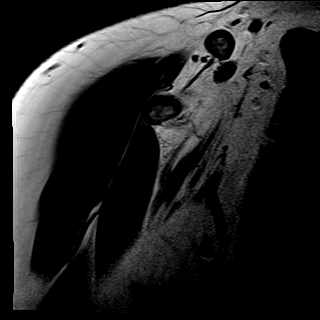
[im 6/20]
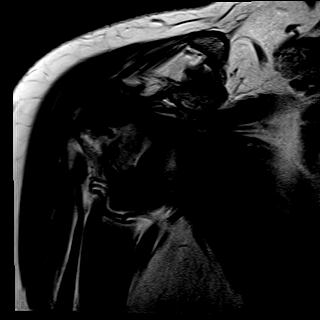
[im 9/20]
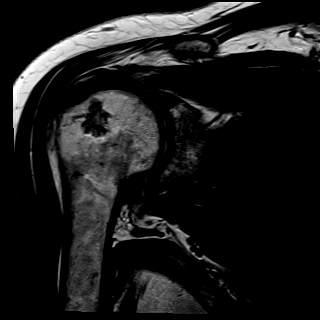
[im 11/20]
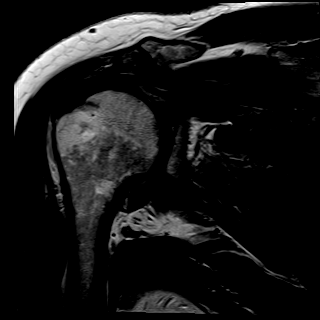
[im 14/20]
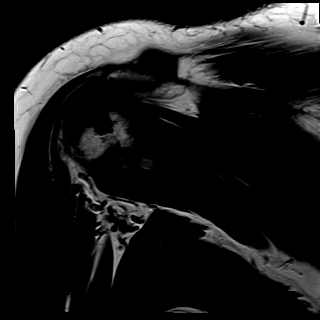
[im 17/20]
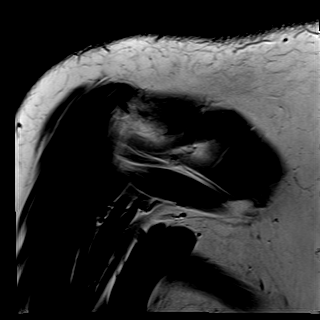
[im 20/20]
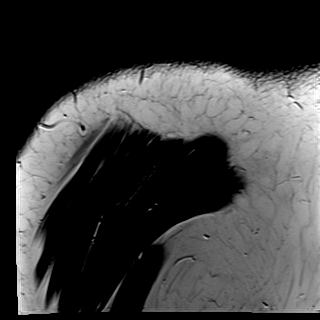

[Series 9: T2 fat-sat · oblique · left · 4.0mm · 0.44mm/px · 4 of 20 slices shown (3 of 3)]
[im 1/20]
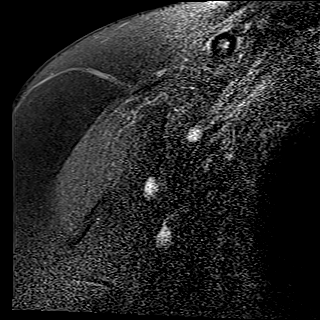
[im 3/20]
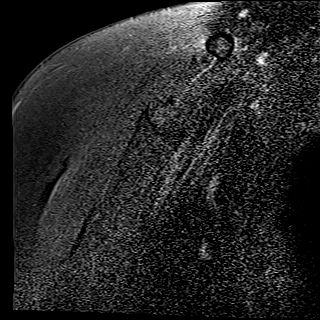
[im 11/20]
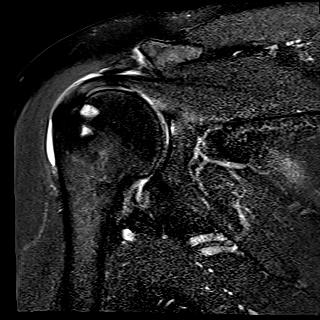
[im 17/20]
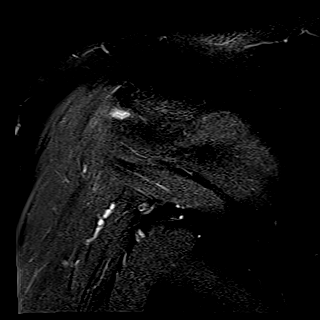

[28 of 40 positions shown; findings below may reference images not displayed]

FINDINGS: Rotator cuff: There is distal supra and infraspinatus tendinosis
with bursal sided fraying. Distal subscapularis tendinosis. Teres
minor is intact

Muscles: No muscle atrophy or edema. No intramuscular fluid
collection or hematoma.

Biceps Long Head: Intraarticular and extraarticular portions of the
biceps tendon are intact.

Acromioclavicular Joint: Mild arthropathy of the acromioclavicular
joint. Small amount subacromial/subdeltoid bursal fluid.

Glenohumeral Joint: There is small joint effusion. Mild chondrosis.
There is edema in the rotator interval with some loss of fat signal.

Labrum: Degenerative posterosuperior labral tearing.

Bones: No fracture or dislocation. No aggressive osseous lesion.
Benign cystic change in the humeral head.

Other: None
IMPRESSION: Distal supraspinatus and infraspinatus tendinosis with bursal sided
fraying. Mild subacromial-subdeltoid bursitis. No high-grade or
retracted cuff tear. No significant muscle atrophy.

Mild glenohumeral and AC joint arthritis. Degenerative
posterosuperior labral tearing.

Small glenohumeral joint effusion. Possible findings of adhesive
capsulitis.

## 2022-08-22 ENCOUNTER — Ambulatory Visit
Admission: RE | Admit: 2022-08-22 | Discharge: 2022-08-22 | Disposition: A | Discharge status: Home / Self Care | Payer: Medicare HMO | Source: Ambulatory Visit | Attending: Internal Medicine | Admitting: Internal Medicine

## 2022-08-22 DIAGNOSIS — Z1231 Encounter for screening mammogram for malignant neoplasm of breast: Secondary | ICD-10-CM | POA: Insufficient documentation

## 2023-02-23 DIAGNOSIS — H2513 Age-related nuclear cataract, bilateral: Secondary | ICD-10-CM | POA: Diagnosis not present

## 2023-02-23 DIAGNOSIS — Z0100 Encounter for examination of eyes and vision without abnormal findings: Secondary | ICD-10-CM | POA: Diagnosis not present

## 2023-03-15 DIAGNOSIS — Z79899 Other long term (current) drug therapy: Secondary | ICD-10-CM | POA: Diagnosis not present

## 2023-03-15 DIAGNOSIS — R739 Hyperglycemia, unspecified: Secondary | ICD-10-CM | POA: Diagnosis not present

## 2023-03-22 DIAGNOSIS — I129 Hypertensive chronic kidney disease with stage 1 through stage 4 chronic kidney disease, or unspecified chronic kidney disease: Secondary | ICD-10-CM | POA: Diagnosis not present

## 2023-03-22 DIAGNOSIS — Z0000 Encounter for general adult medical examination without abnormal findings: Secondary | ICD-10-CM | POA: Diagnosis not present

## 2023-03-22 DIAGNOSIS — Z6841 Body Mass Index (BMI) 40.0 and over, adult: Secondary | ICD-10-CM | POA: Diagnosis not present

## 2023-03-22 DIAGNOSIS — Z1331 Encounter for screening for depression: Secondary | ICD-10-CM | POA: Diagnosis not present

## 2023-03-22 DIAGNOSIS — Z79899 Other long term (current) drug therapy: Secondary | ICD-10-CM | POA: Diagnosis not present

## 2023-03-22 DIAGNOSIS — N183 Chronic kidney disease, stage 3 unspecified: Secondary | ICD-10-CM | POA: Diagnosis not present

## 2023-03-22 DIAGNOSIS — E669 Obesity, unspecified: Secondary | ICD-10-CM | POA: Diagnosis not present

## 2023-05-01 DIAGNOSIS — N183 Chronic kidney disease, stage 3 unspecified: Secondary | ICD-10-CM | POA: Diagnosis not present

## 2023-05-01 DIAGNOSIS — M7912 Myalgia of auxiliary muscles, head and neck: Secondary | ICD-10-CM | POA: Diagnosis not present

## 2023-05-01 DIAGNOSIS — M542 Cervicalgia: Secondary | ICD-10-CM | POA: Diagnosis not present

## 2023-07-25 ENCOUNTER — Other Ambulatory Visit: Payer: Self-pay | Admitting: Internal Medicine

## 2023-07-25 DIAGNOSIS — Z1231 Encounter for screening mammogram for malignant neoplasm of breast: Secondary | ICD-10-CM

## 2023-08-23 ENCOUNTER — Ambulatory Visit
Admission: RE | Admit: 2023-08-23 | Discharge: 2023-08-23 | Disposition: A | Discharge status: Home / Self Care | Source: Ambulatory Visit | Attending: Internal Medicine | Admitting: Internal Medicine

## 2023-08-23 DIAGNOSIS — Z1231 Encounter for screening mammogram for malignant neoplasm of breast: Secondary | ICD-10-CM | POA: Insufficient documentation

## 2023-11-23 DIAGNOSIS — H524 Presbyopia: Secondary | ICD-10-CM | POA: Diagnosis not present
